# Patient Record
Sex: Male | Born: 1955 | Race: White | Hispanic: No | Marital: Single | Smoking: Current some day smoker
Health system: Southern US, Community
[De-identification: ages and names within clinical notes are randomized; demographics above are authoritative.]

## PROBLEM LIST (undated history)

## (undated) HISTORY — PX: COLOSTOMY: SHX63

---

## 2006-06-03 ENCOUNTER — Inpatient Hospital Stay: Payer: Self-pay | Admitting: General Surgery

## 2008-04-24 ENCOUNTER — Emergency Department: Payer: Self-pay | Admitting: Emergency Medicine

## 2008-05-02 IMAGING — CT CT ABD-PELV W/O CM
1 of 2 series · 15 of 32 positions shown, 19 images · non-contrast
Comparison: none

REASON FOR EXAM: (1) r/o  appy  rm 12; (2) r/o  appy  rm 12  RLQ PAIN
COMMENTS:

[Series 2: abd/pelvis · axial · 0.65mm/px · z∈[-508,-145]mm · 15 of 136 slices shown, 19 images]
[im 10/136  soft-tissue]
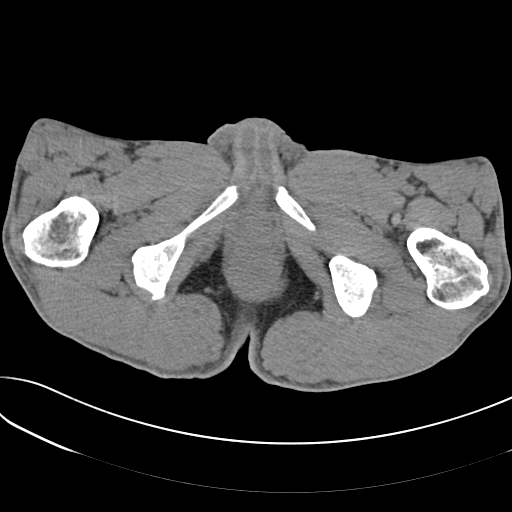
[im 10/136  bone]
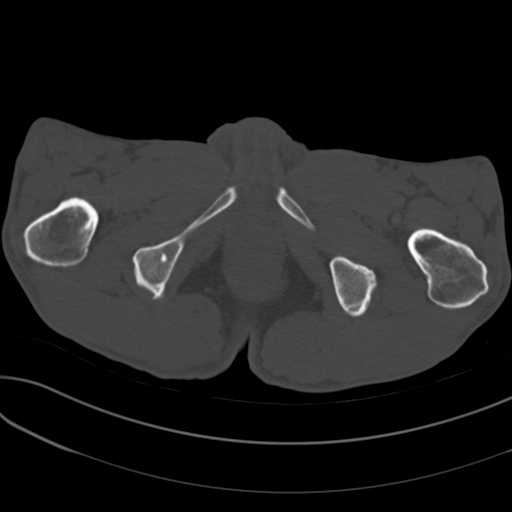
[im 19/136  soft-tissue]
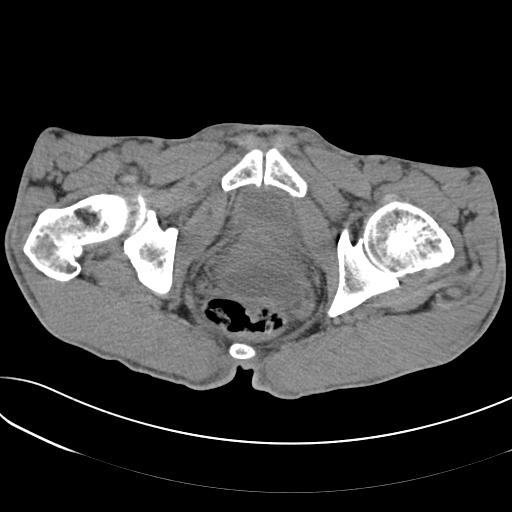
[im 28/136  soft-tissue]
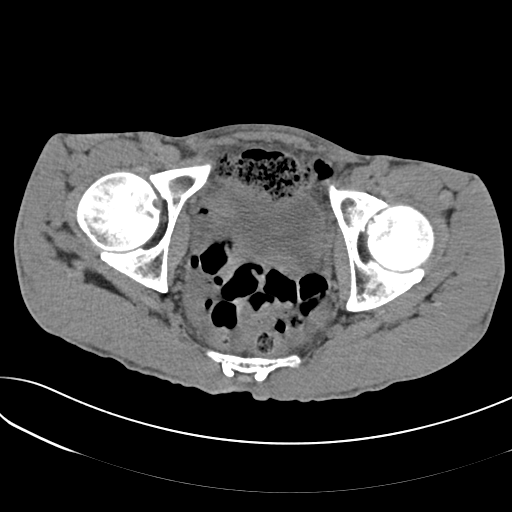
[im 38/136  soft-tissue]
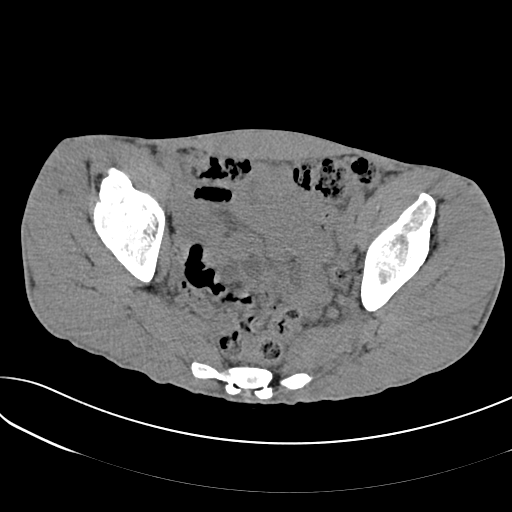
[im 47/136  soft-tissue]
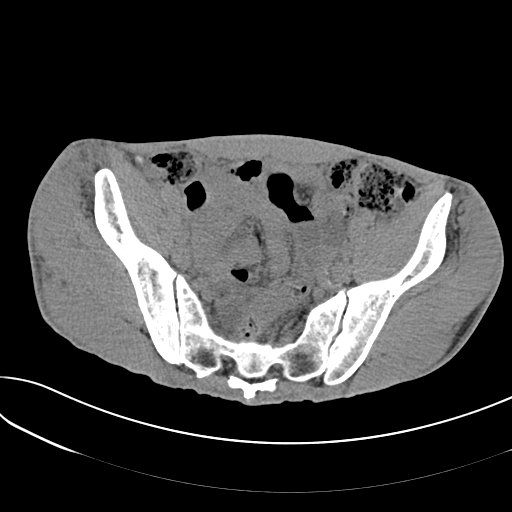
[im 56/136  soft-tissue]
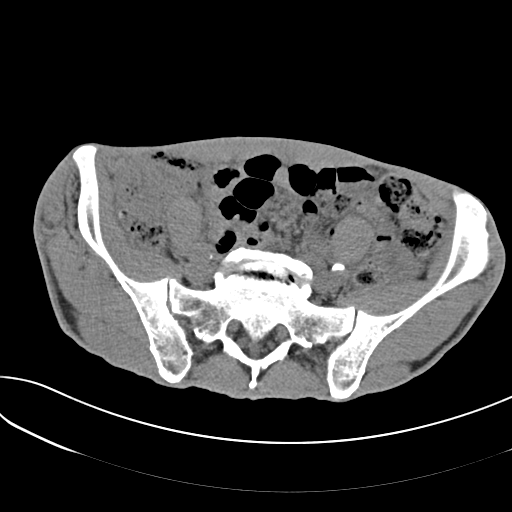
[im 70/136  soft-tissue]
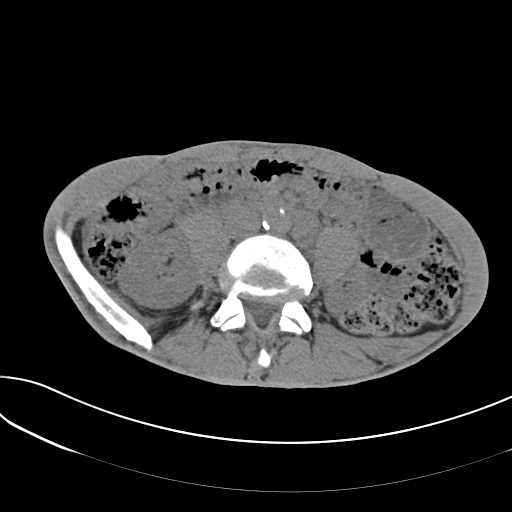
[im 80/136  soft-tissue]
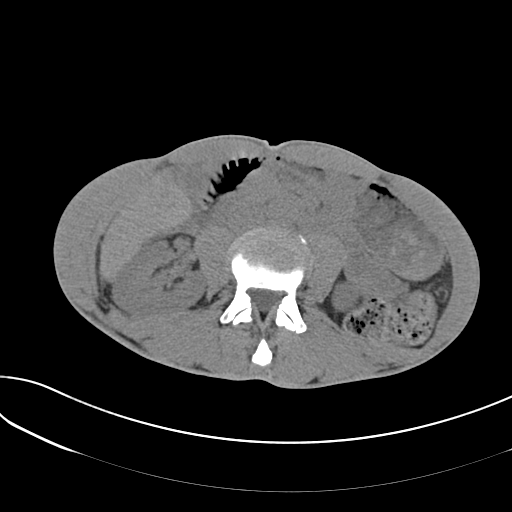
[im 89/136  soft-tissue]
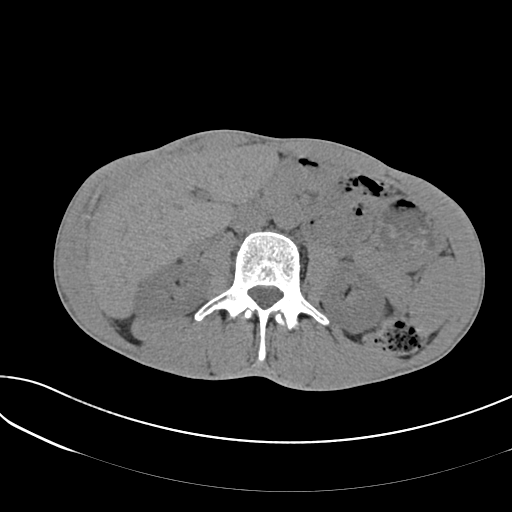
[im 89/136  bone]
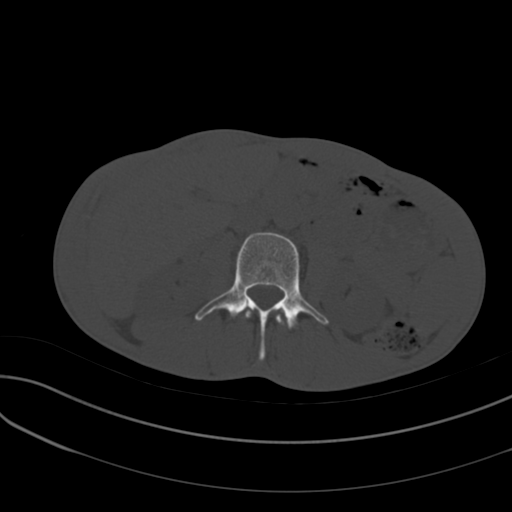
[im 98/136  soft-tissue]
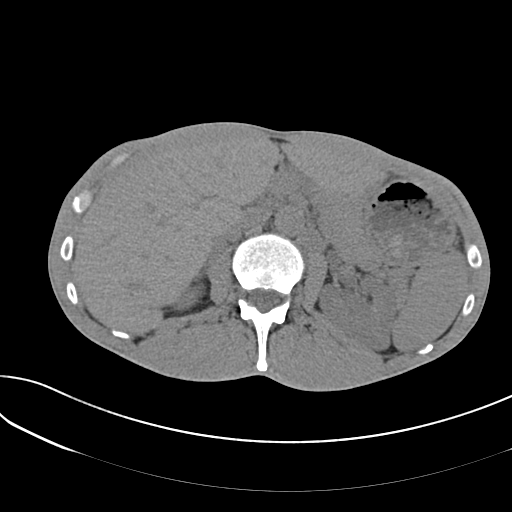
[im 108/136  soft-tissue]
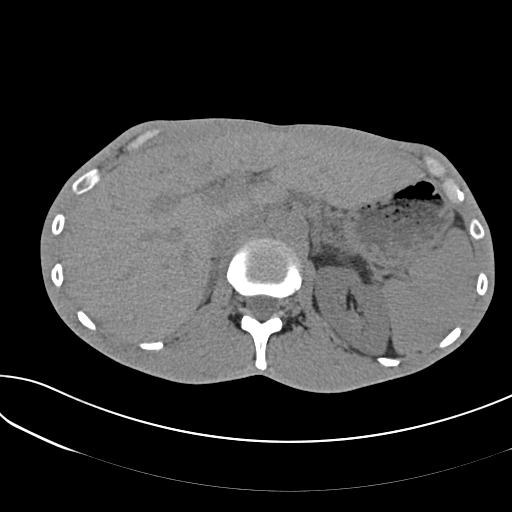
[im 117/136  soft-tissue]
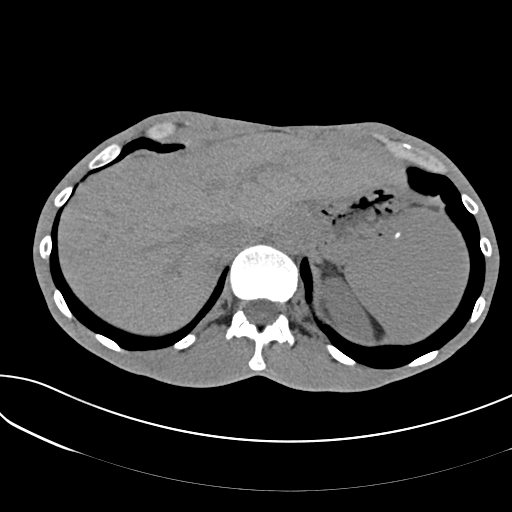
[im 117/136  lung]
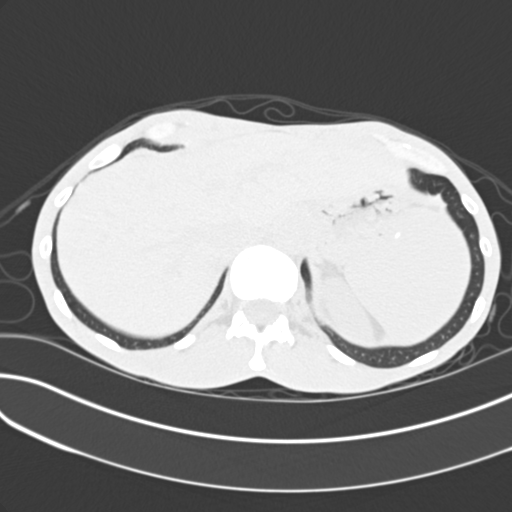
[im 122/136  lung]
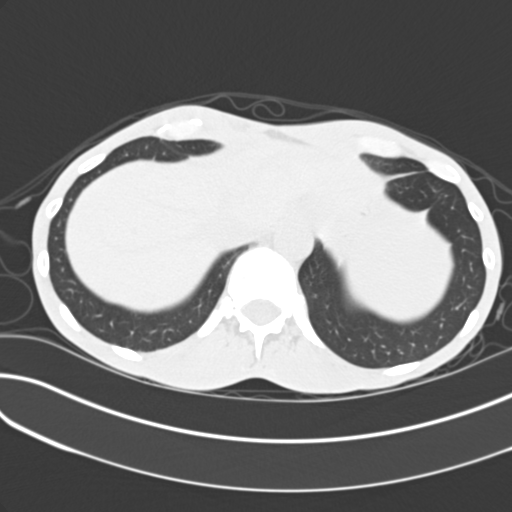
[im 126/136  soft-tissue]
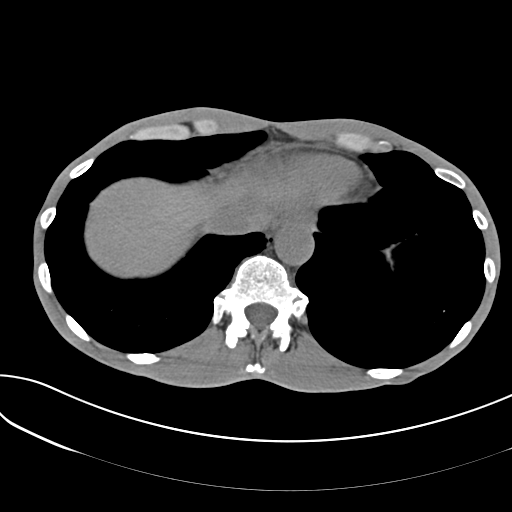
[im 126/136  lung]
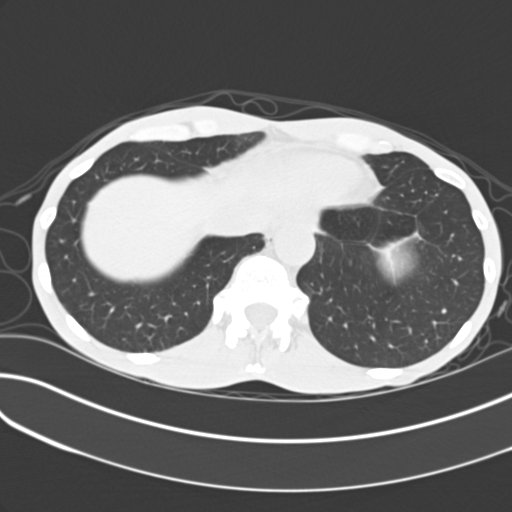
[im 131/136  lung]
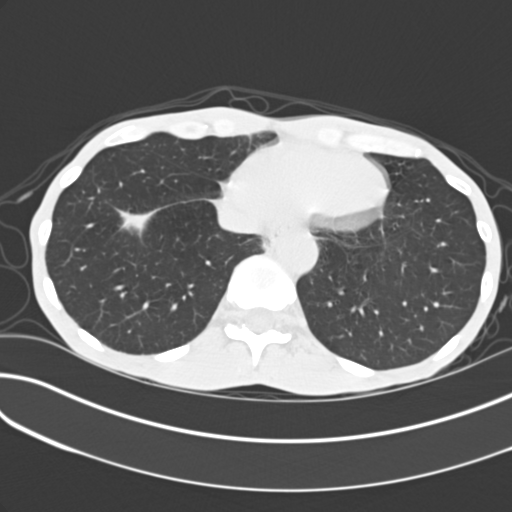

[15 of 32 positions shown; findings below may reference images not displayed]

PROCEDURE:     CT  - CT ABDOMEN AND PELVIS W[DATE] [DATE]

RESULT:       Nonenhanced CT of the Abdomen and Pelvis was obtained.  The
liver and spleen are unremarkable.  No evidence of biliary distention. There
is no bowel distention.  Pericecal mild fluid versus intracecal fluid noted.
 This is of uncertain etiology.  This could represent a diverticulum or
inflammatory change.  No definite appendiceal abnormality is identified,
however, without contrast and given the paucity of fat in the patient the
appendix is difficult to definitely identify.  No free pelvic fluid is
noted.  The lung bases are clear.
IMPRESSION: Pericecal fluid collection as described above.  It may be wise to perform IV
and oral contrast enhanced CT of this patient.

Initial report was faxed to the Emergency Room physician at the time of the
study.  The initial preliminary results were also discussed with the
Emergency Room physician at the time of the study.

## 2008-11-13 ENCOUNTER — Emergency Department: Payer: Self-pay | Admitting: Emergency Medicine

## 2010-03-25 IMAGING — US US EXTREM LOW VENOUS*R*
1 series · 17 of 24 positions shown · non-contrast
Comparison: none

REASON FOR EXAM: swelling and warmth x 5 days
COMMENTS:

PROCEDURE:     US  - US DOPPLER LOW EXTR RIGHT  - April 24, 2008  [DATE]
RESULT:     The augmentation flow waveforms are normal. The RIGHT femoral
and popliteal vein shows complete compressibility throughout its course.
Doppler examination shows no occlusion or evidence of deep vein thrombosis.

[Series 1: us extrem low venous*right* · 17 of 31 slices shown]
[im 1/31]
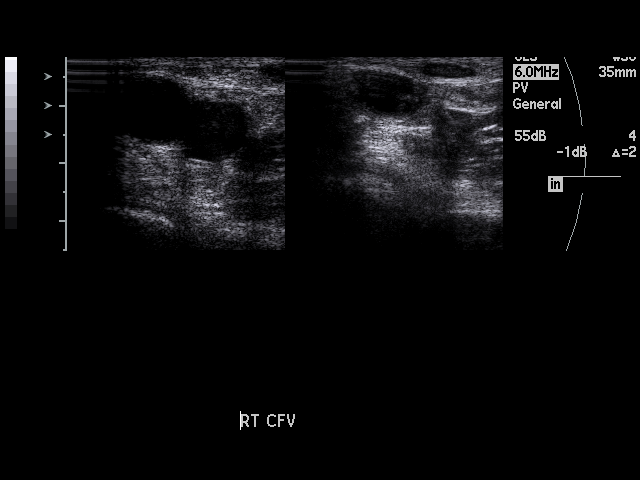
[im 3/31]
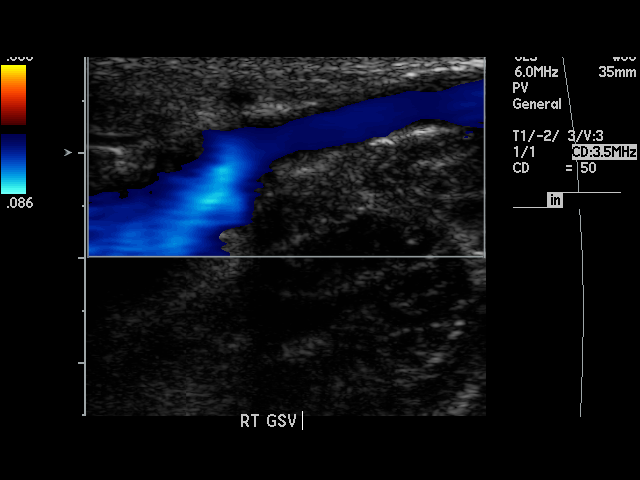
[im 4/31]
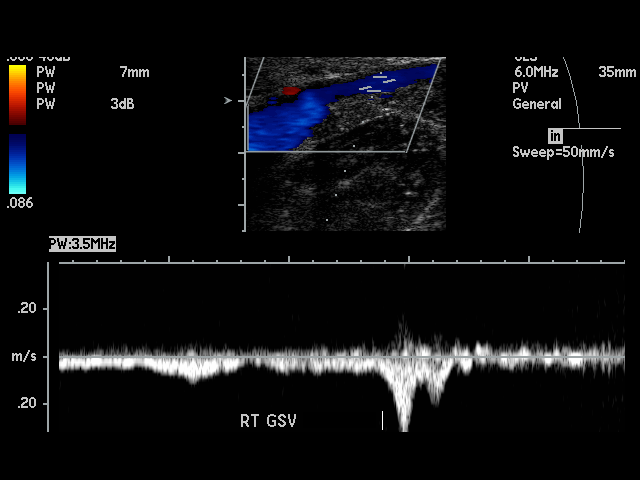
[im 6/31]
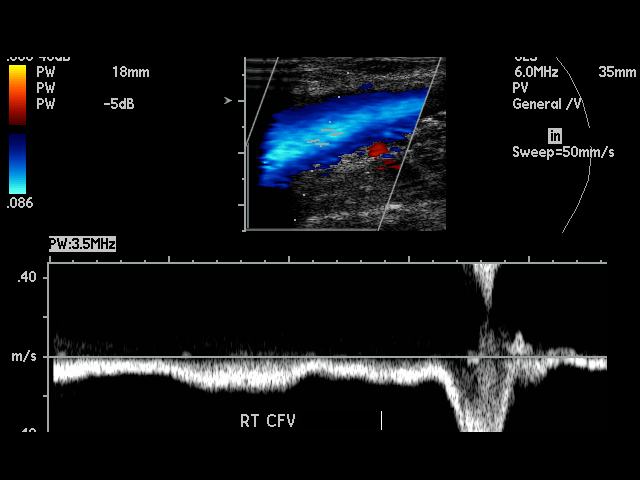
[im 8/31]
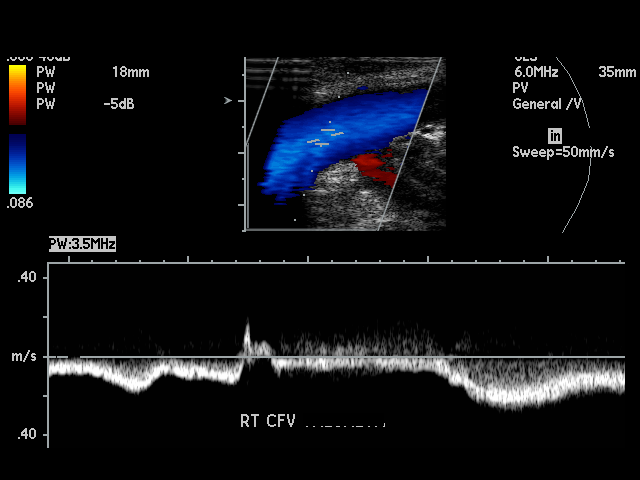
[im 10/31]
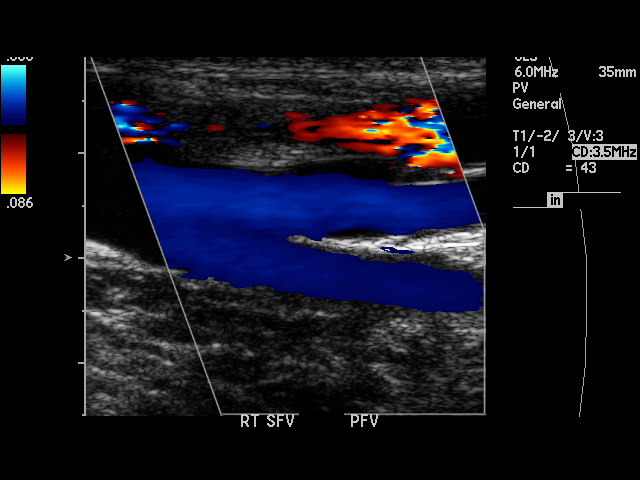
[im 12/31]
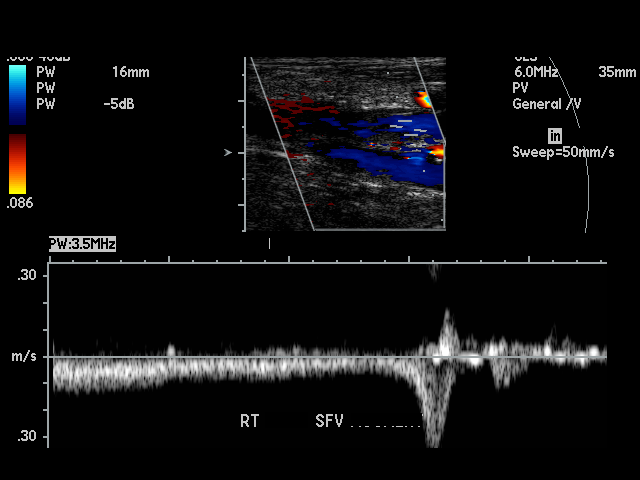
[im 14/31]
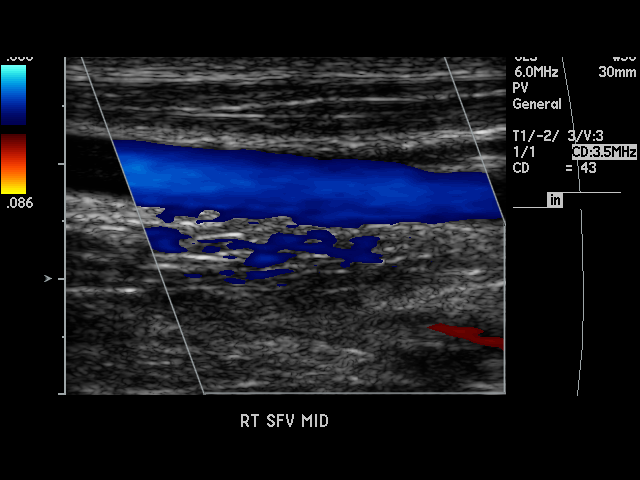
[im 16/31]
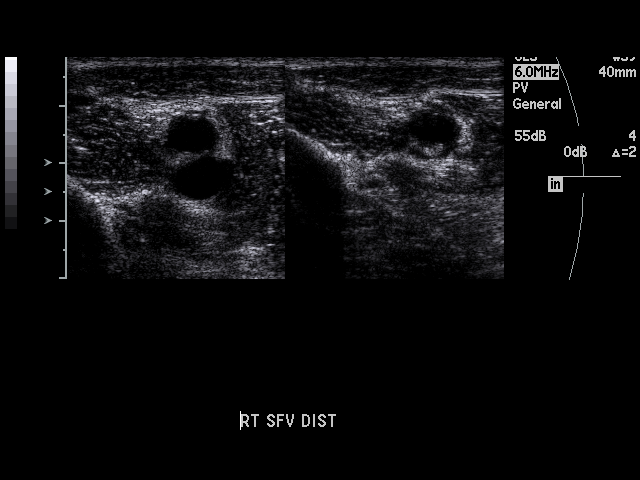
[im 17/31]
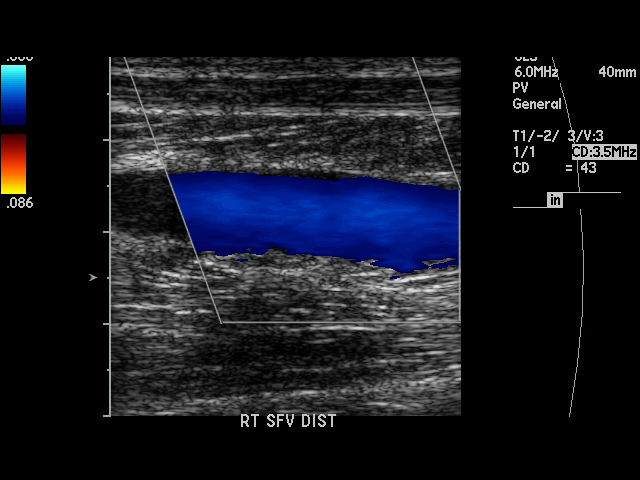
[im 19/31]
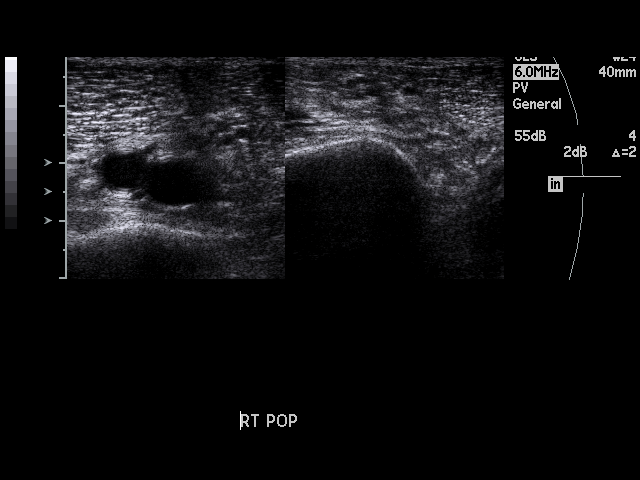
[im 21/31]
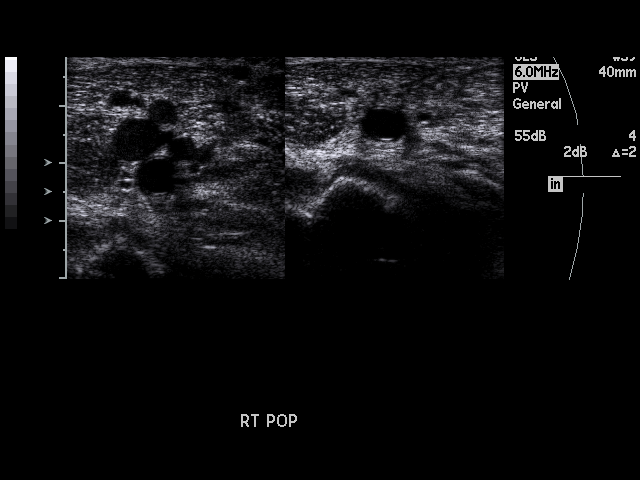
[im 23/31]
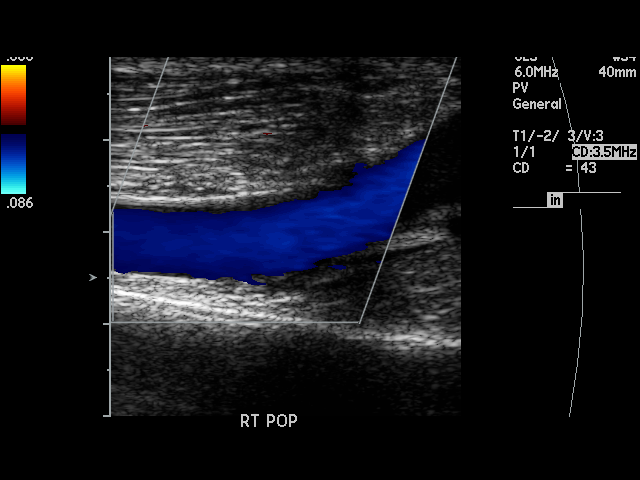
[im 25/31]
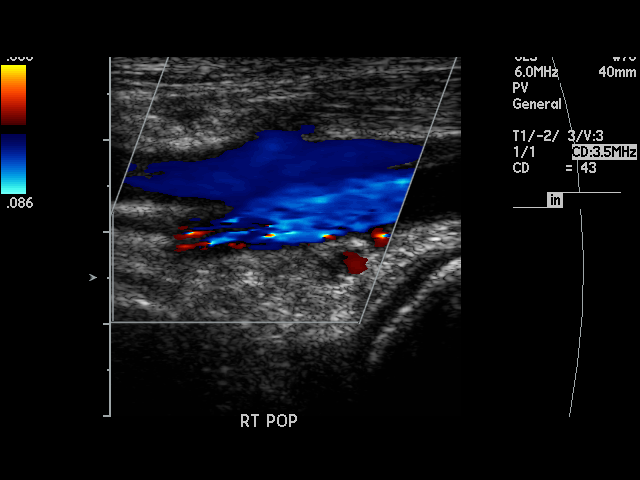
[im 27/31]
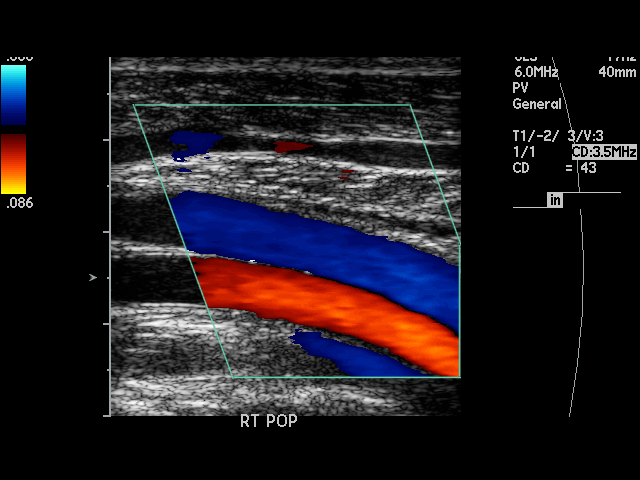
[im 28/31]
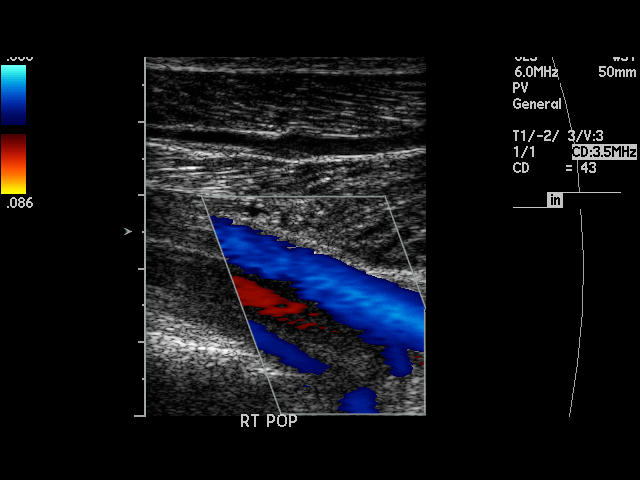
[im 31/31]
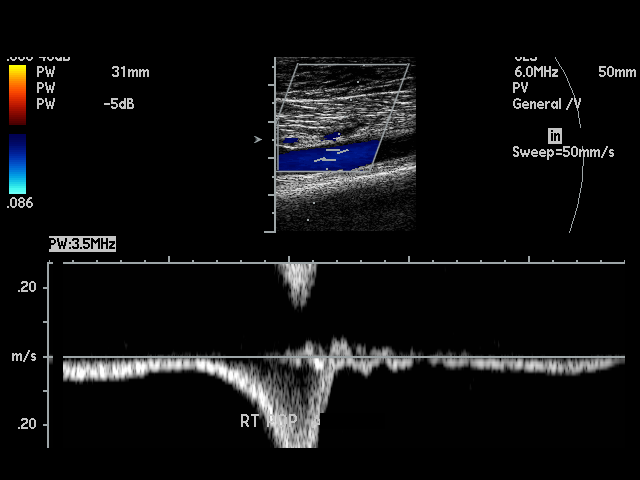

[17 of 24 positions shown; findings below may reference images not displayed]

IMPRESSION: 1.     No deep vein thrombosis is identified in the RIGHT leg.

## 2018-06-03 ENCOUNTER — Other Ambulatory Visit: Payer: Self-pay

## 2018-06-03 ENCOUNTER — Encounter: Payer: Self-pay | Admitting: Emergency Medicine

## 2018-06-03 ENCOUNTER — Emergency Department: Payer: Medicaid Other

## 2018-06-03 ENCOUNTER — Emergency Department
Admission: EM | Admit: 2018-06-03 | Discharge: 2018-06-04 | Disposition: A | Discharge status: Other Acute Inpatient Hospital | Payer: Medicaid Other | Attending: Emergency Medicine | Admitting: Emergency Medicine

## 2018-06-03 DIAGNOSIS — F17200 Nicotine dependence, unspecified, uncomplicated: Secondary | ICD-10-CM | POA: Insufficient documentation

## 2018-06-03 DIAGNOSIS — S2241XA Multiple fractures of ribs, right side, initial encounter for closed fracture: Secondary | ICD-10-CM | POA: Insufficient documentation

## 2018-06-03 DIAGNOSIS — Y929 Unspecified place or not applicable: Secondary | ICD-10-CM | POA: Diagnosis not present

## 2018-06-03 DIAGNOSIS — Y939 Activity, unspecified: Secondary | ICD-10-CM | POA: Diagnosis not present

## 2018-06-03 DIAGNOSIS — Y999 Unspecified external cause status: Secondary | ICD-10-CM | POA: Diagnosis not present

## 2018-06-03 DIAGNOSIS — W03XXXA Other fall on same level due to collision with another person, initial encounter: Secondary | ICD-10-CM | POA: Insufficient documentation

## 2018-06-03 DIAGNOSIS — J939 Pneumothorax, unspecified: Secondary | ICD-10-CM | POA: Diagnosis not present

## 2018-06-03 DIAGNOSIS — S299XXA Unspecified injury of thorax, initial encounter: Secondary | ICD-10-CM | POA: Diagnosis present

## 2018-06-03 LAB — CBC WITH DIFFERENTIAL/PLATELET
Abs Immature Granulocytes: 0.04 10*3/uL (ref 0.00–0.07)
Basophils Absolute: 0.1 10*3/uL (ref 0.0–0.1)
Basophils Relative: 1 %
Eosinophils Absolute: 0.2 10*3/uL (ref 0.0–0.5)
Eosinophils Relative: 2 %
HCT: 45.2 % (ref 39.0–52.0)
Hemoglobin: 15.1 g/dL (ref 13.0–17.0)
Immature Granulocytes: 0 %
Lymphocytes Relative: 17 %
Lymphs Abs: 1.6 10*3/uL (ref 0.7–4.0)
MCH: 32 pg (ref 26.0–34.0)
MCHC: 33.4 g/dL (ref 30.0–36.0)
MCV: 95.8 fL (ref 80.0–100.0)
Monocytes Absolute: 0.6 10*3/uL (ref 0.1–1.0)
Monocytes Relative: 6 %
Neutro Abs: 7.2 10*3/uL (ref 1.7–7.7)
Neutrophils Relative %: 74 %
Platelets: 142 10*3/uL — ABNORMAL LOW (ref 150–400)
RBC: 4.72 MIL/uL (ref 4.22–5.81)
RDW: 12.5 % (ref 11.5–15.5)
WBC: 9.7 10*3/uL (ref 4.0–10.5)
nRBC: 0 % (ref 0.0–0.2)

## 2018-06-03 LAB — BASIC METABOLIC PANEL
Anion gap: 10 (ref 5–15)
BUN: 16 mg/dL (ref 8–23)
CO2: 26 mmol/L (ref 22–32)
Calcium: 8.8 mg/dL — ABNORMAL LOW (ref 8.9–10.3)
Chloride: 98 mmol/L (ref 98–111)
Creatinine, Ser: 0.75 mg/dL (ref 0.61–1.24)
GFR calc Af Amer: >60 mL/min (ref >60)
GFR calc non Af Amer: >60 mL/min (ref >60)
Glucose, Bld: 121 mg/dL — ABNORMAL HIGH (ref 70–99)
Potassium: 5.6 mmol/L — ABNORMAL HIGH (ref 3.5–5.1)
Sodium: 134 mmol/L — ABNORMAL LOW (ref 135–145)

## 2018-06-03 MED ORDER — MORPHINE SULFATE (PF) 4 MG/ML IV SOLN
4.0000 mg | Freq: Once | INTRAVENOUS | Status: AC
  Administered 2018-06-03: 4 mg via INTRAVENOUS
  Filled 2018-06-03: qty 1

## 2018-06-03 MED ORDER — MORPHINE SULFATE (PF) 4 MG/ML IV SOLN
4.0000 mg | Freq: Once | INTRAVENOUS | Status: AC
  Administered 2018-06-03: 23:00:00 4 mg via INTRAVENOUS
  Filled 2018-06-03: qty 1
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
## 2018-06-04 NOTE — ED Notes (Signed)
[facility] reviewed by this nurse. Appears complete at time of transport.

## 2018-06-04 NOTE — ED Notes (Signed)
Pt off the floor with [facility] at this time to [facility] ED.

## 2023-03-03 ENCOUNTER — Other Ambulatory Visit (HOSPITAL_COMMUNITY): Payer: Self-pay
